# Patient Record
Sex: Male | Born: 1955 | Race: Black or African American | Hispanic: No | Marital: Married
Health system: Southern US, Community
[De-identification: ages and names within clinical notes are randomized; demographics above are authoritative.]

---

## 1999-11-11 ENCOUNTER — Encounter: Payer: Self-pay | Admitting: Orthopedic Surgery

## 1999-11-11 ENCOUNTER — Encounter: Admission: RE | Admit: 1999-11-11 | Discharge: 1999-11-11 | Payer: Self-pay | Admitting: Orthopedic Surgery

## 2000-03-08 ENCOUNTER — Encounter: Payer: Self-pay | Admitting: Orthopedic Surgery

## 2000-03-08 ENCOUNTER — Encounter: Admission: RE | Admit: 2000-03-08 | Discharge: 2000-03-08 | Payer: Self-pay | Admitting: Orthopedic Surgery

## 2001-02-22 ENCOUNTER — Encounter: Payer: Self-pay | Admitting: Orthopedic Surgery

## 2001-02-22 ENCOUNTER — Encounter: Admission: RE | Admit: 2001-02-22 | Discharge: 2001-02-22 | Payer: Self-pay | Admitting: Orthopedic Surgery

## 2001-03-13 ENCOUNTER — Encounter: Admission: RE | Admit: 2001-03-13 | Discharge: 2001-03-13 | Payer: Self-pay | Admitting: Orthopedic Surgery

## 2001-03-13 ENCOUNTER — Encounter: Payer: Self-pay | Admitting: Orthopedic Surgery

## 2001-03-28 ENCOUNTER — Encounter: Admission: RE | Admit: 2001-03-28 | Discharge: 2001-03-28 | Payer: Self-pay | Admitting: Orthopedic Surgery

## 2001-03-28 ENCOUNTER — Encounter: Payer: Self-pay | Admitting: Orthopedic Surgery

## 2001-08-23 ENCOUNTER — Encounter: Admission: RE | Admit: 2001-08-23 | Discharge: 2001-08-23 | Payer: Self-pay | Admitting: Orthopedic Surgery

## 2001-08-23 ENCOUNTER — Encounter: Payer: Self-pay | Admitting: Orthopedic Surgery

## 2003-02-18 ENCOUNTER — Encounter: Admission: RE | Admit: 2003-02-18 | Discharge: 2003-02-18 | Payer: Self-pay | Admitting: Orthopedic Surgery

## 2003-02-18 ENCOUNTER — Encounter: Payer: Self-pay | Admitting: Orthopedic Surgery

## 2005-09-11 ENCOUNTER — Encounter: Admission: RE | Admit: 2005-09-11 | Discharge: 2005-09-11 | Payer: Self-pay | Admitting: Orthopedic Surgery

## 2006-04-15 ENCOUNTER — Inpatient Hospital Stay (HOSPITAL_COMMUNITY): Admission: RE | Admit: 2006-04-15 | Discharge: 2006-04-20 | Payer: Self-pay | Admitting: Orthopedic Surgery

## 2007-08-02 ENCOUNTER — Encounter: Admission: RE | Admit: 2007-08-02 | Discharge: 2007-08-02 | Payer: Self-pay | Admitting: Orthopedic Surgery

## 2007-12-23 IMAGING — CR DG CHEST 2V
2 series · 2 of 2 positions shown · non-contrast
Comparison: None.

CLINICAL DATA: Preadmission chest x-ray for osteoarthritis.

[view not recorded (1 of 2)]
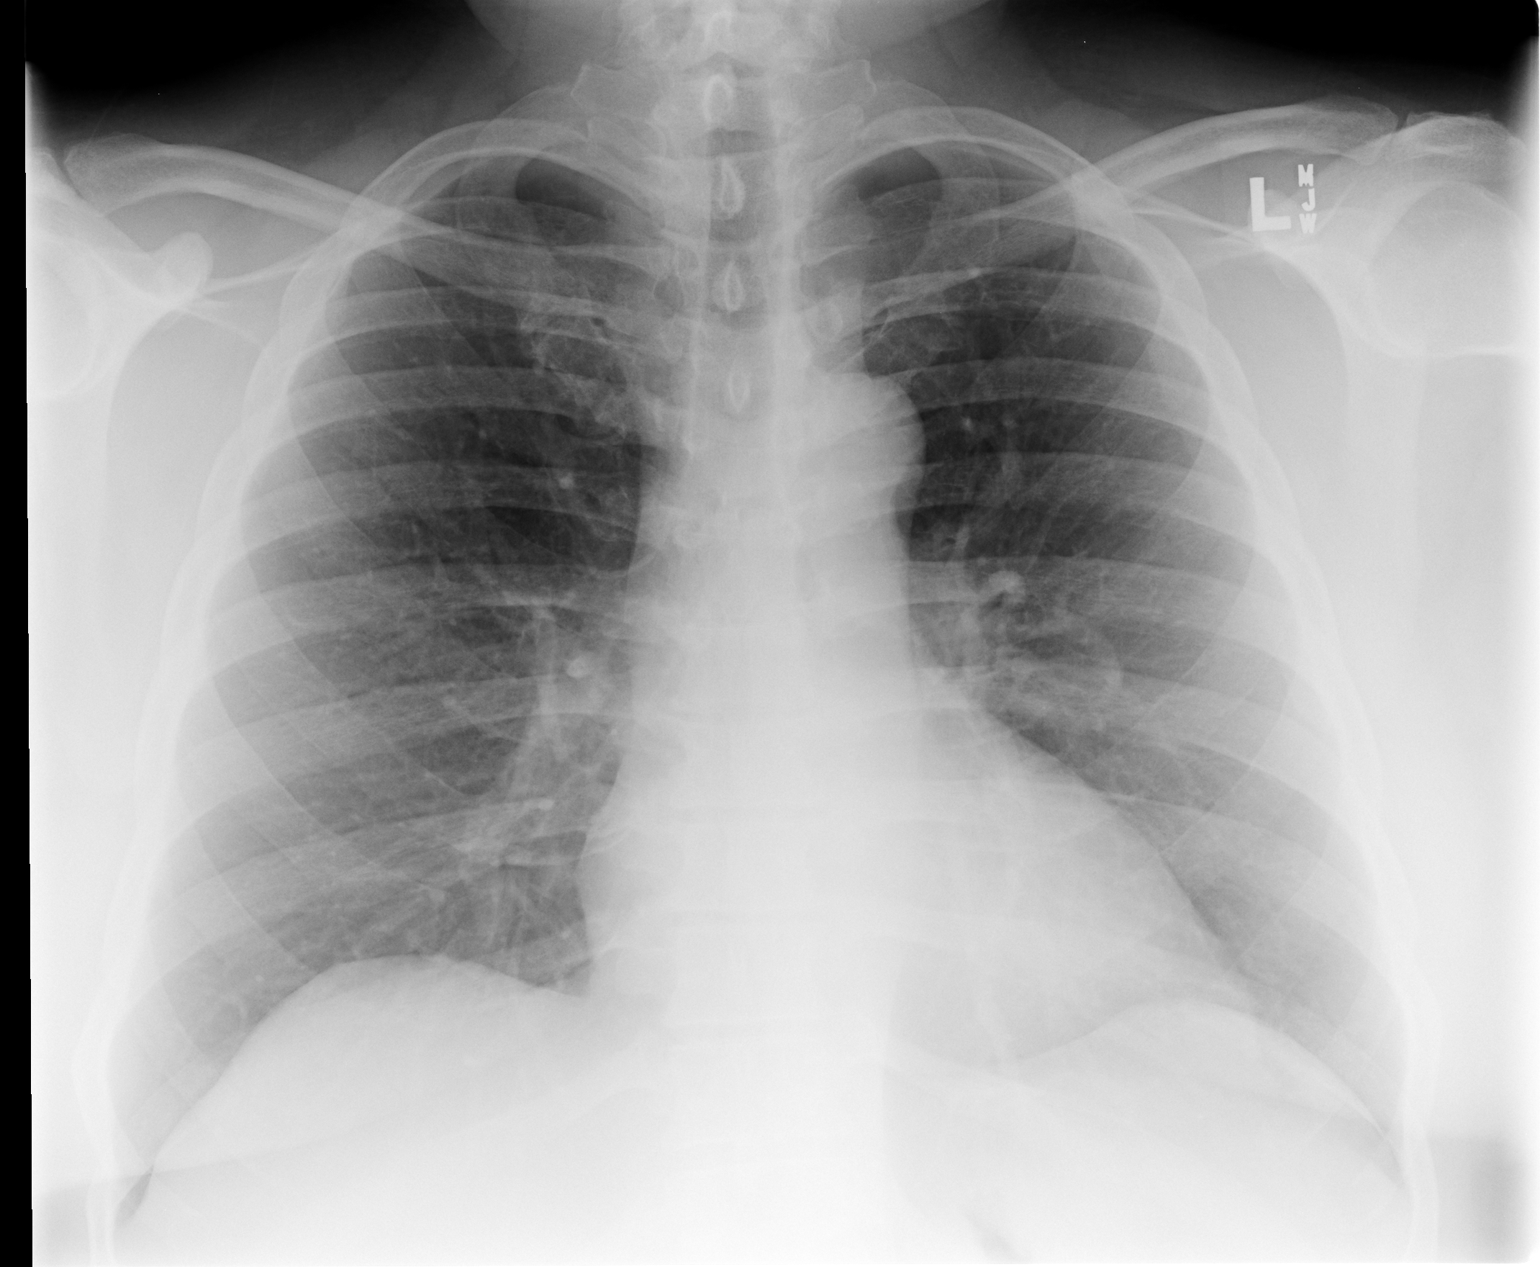

[view not recorded (2 of 2)]
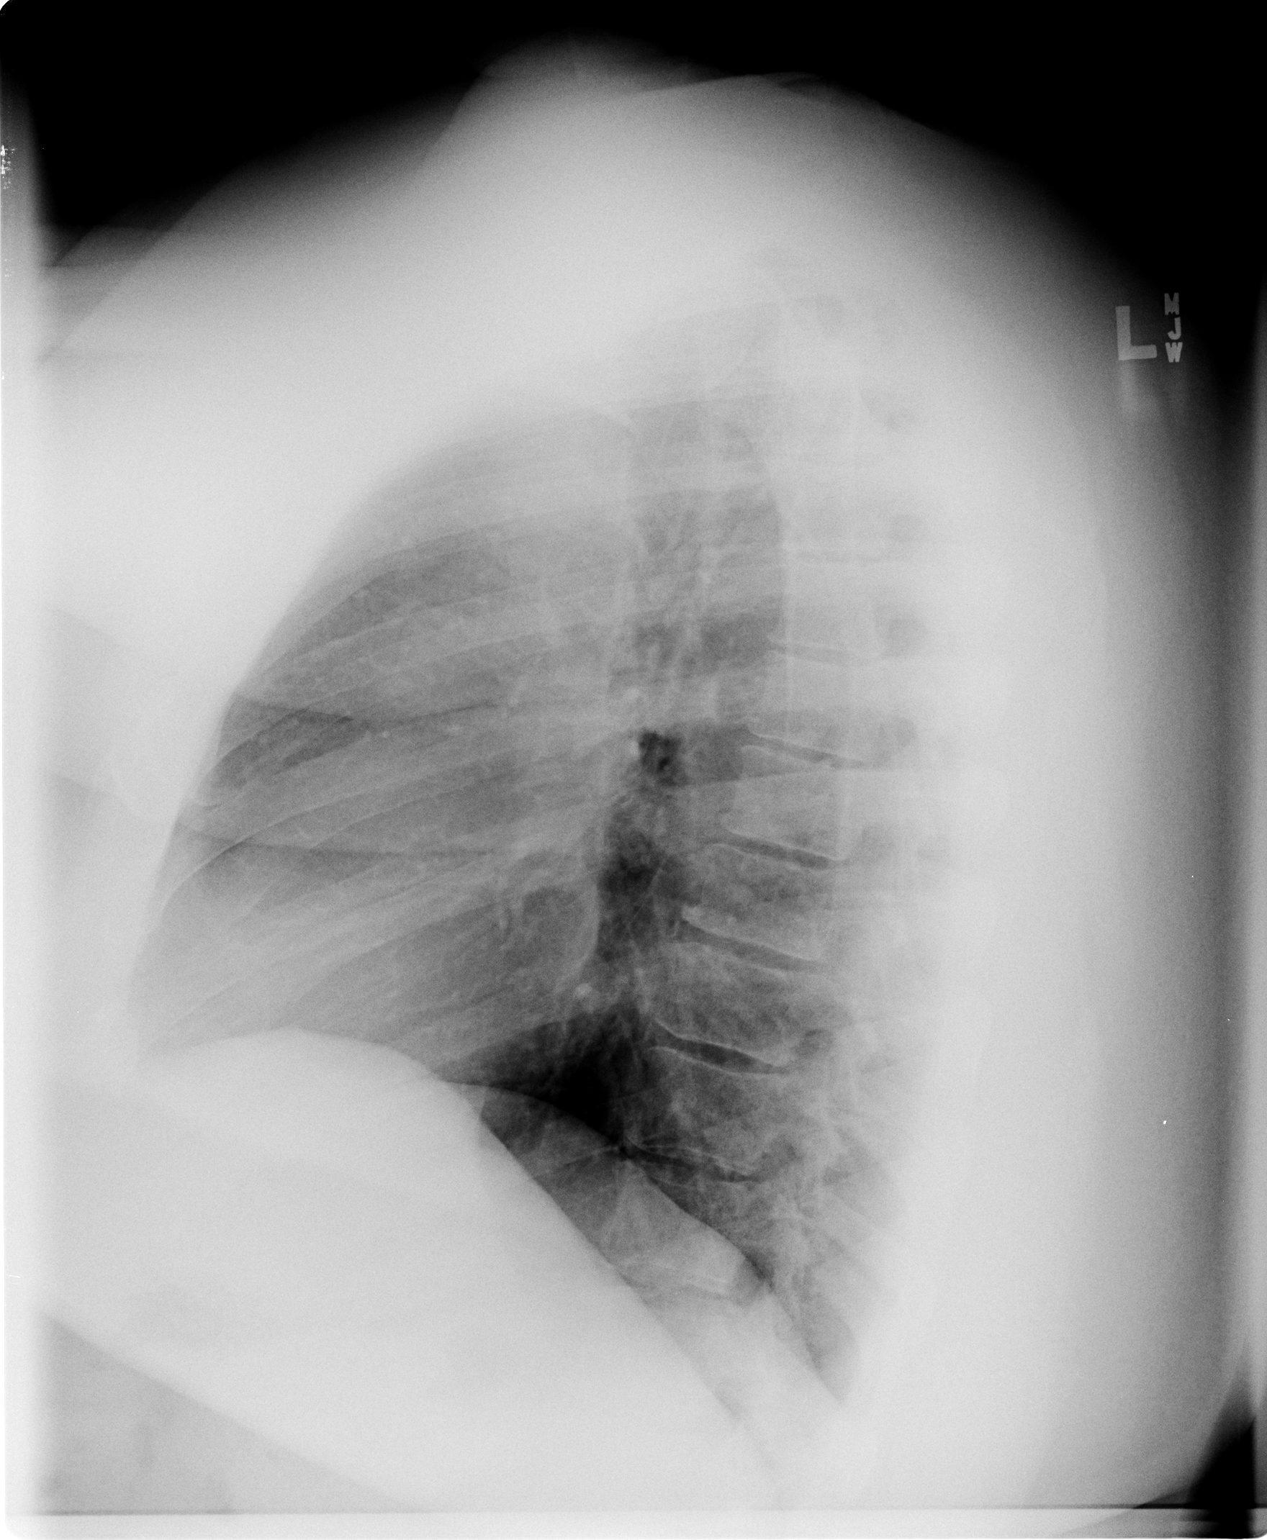

[2 of 2 positions shown; findings below may reference images not displayed]

CHEST - 2 VIEW:

The lungs are clear.  The cardiopericardial silhouette is within normal limits
for size.  Visualized bony structures of the thorax are intact.
IMPRESSION: No acute cardiopulmonary process

## 2008-09-20 ENCOUNTER — Inpatient Hospital Stay (HOSPITAL_COMMUNITY): Admission: RE | Admit: 2008-09-20 | Discharge: 2008-09-23 | Payer: Self-pay | Admitting: Orthopedic Surgery

## 2010-09-16 LAB — GLUCOSE, CAPILLARY
Glucose-Capillary: 109 mg/dL — ABNORMAL HIGH (ref 70–99)
Glucose-Capillary: 121 mg/dL — ABNORMAL HIGH (ref 70–99)
Glucose-Capillary: 126 mg/dL — ABNORMAL HIGH (ref 70–99)
Glucose-Capillary: 132 mg/dL — ABNORMAL HIGH (ref 70–99)
Glucose-Capillary: 138 mg/dL — ABNORMAL HIGH (ref 70–99)
Glucose-Capillary: 141 mg/dL — ABNORMAL HIGH (ref 70–99)
Glucose-Capillary: 145 mg/dL — ABNORMAL HIGH (ref 70–99)
Glucose-Capillary: 145 mg/dL — ABNORMAL HIGH (ref 70–99)
Glucose-Capillary: 180 mg/dL — ABNORMAL HIGH (ref 70–99)
Glucose-Capillary: 96 mg/dL (ref 70–99)

## 2010-09-16 LAB — URINALYSIS, ROUTINE W REFLEX MICROSCOPIC
Bilirubin Urine: NEGATIVE
Hgb urine dipstick: NEGATIVE
Protein, ur: NEGATIVE mg/dL
Urobilinogen, UA: 0.2 mg/dL (ref 0.0–1.0)

## 2010-09-16 LAB — BASIC METABOLIC PANEL
BUN: 9 mg/dL (ref 6–23)
CO2: 27 mEq/L (ref 19–32)
CO2: 29 mEq/L (ref 19–32)
Calcium: 8.5 mg/dL (ref 8.4–10.5)
Calcium: 8.6 mg/dL (ref 8.4–10.5)
Chloride: 102 mEq/L (ref 96–112)
Chloride: 103 mEq/L (ref 96–112)
Creatinine, Ser: 0.76 mg/dL (ref 0.4–1.5)
Creatinine, Ser: 0.8 mg/dL (ref 0.4–1.5)
GFR calc Af Amer: >60 mL/min (ref >60)
GFR calc Af Amer: >60 mL/min (ref >60)
GFR calc non Af Amer: >60 mL/min (ref >60)
Glucose, Bld: 113 mg/dL — ABNORMAL HIGH (ref 70–99)
Glucose, Bld: 138 mg/dL — ABNORMAL HIGH (ref 70–99)
Potassium: 3.5 mEq/L (ref 3.5–5.1)
Potassium: 3.9 mEq/L (ref 3.5–5.1)
Sodium: 134 mEq/L — ABNORMAL LOW (ref 135–145)
Sodium: 137 mEq/L (ref 135–145)

## 2010-09-16 LAB — DIFFERENTIAL
Eosinophils Absolute: 0.2 10*3/uL (ref 0.0–0.7)
Lymphocytes Relative: 25 % (ref 12–46)
Lymphs Abs: 2 10*3/uL (ref 0.7–4.0)
Neutro Abs: 5.5 10*3/uL (ref 1.7–7.7)
Neutrophils Relative %: 67 % (ref 43–77)

## 2010-09-16 LAB — COMPREHENSIVE METABOLIC PANEL
ALT: 18 U/L (ref 0–53)
BUN: 12 mg/dL (ref 6–23)
CO2: 26 mEq/L (ref 19–32)
Calcium: 9.2 mg/dL (ref 8.4–10.5)
Creatinine, Ser: 0.86 mg/dL (ref 0.4–1.5)
GFR calc non Af Amer: >60 mL/min (ref >60)
Glucose, Bld: 121 mg/dL — ABNORMAL HIGH (ref 70–99)

## 2010-09-16 LAB — CBC
HCT: 32.1 % — ABNORMAL LOW (ref 39.0–52.0)
HCT: 32.3 % — ABNORMAL LOW (ref 39.0–52.0)
HCT: 41.3 % (ref 39.0–52.0)
Hemoglobin: 10.7 g/dL — ABNORMAL LOW (ref 13.0–17.0)
Hemoglobin: 11 g/dL — ABNORMAL LOW (ref 13.0–17.0)
Hemoglobin: 13.8 g/dL (ref 13.0–17.0)
MCHC: 33 g/dL (ref 30.0–36.0)
MCHC: 33.5 g/dL (ref 30.0–36.0)
MCV: 84.4 fL (ref 78.0–100.0)
MCV: 83.8 fL (ref 78.0–100.0)
Platelets: 244 10*3/uL (ref 150–400)
RBC: 3.79 MIL/uL — ABNORMAL LOW (ref 4.22–5.81)
RBC: 3.9 MIL/uL — ABNORMAL LOW (ref 4.22–5.81)
RBC: 4.93 MIL/uL (ref 4.22–5.81)
RDW: 15.1 % (ref 11.5–15.5)
WBC: 9.8 10*3/uL (ref 4.0–10.5)

## 2010-09-16 LAB — URINE CULTURE

## 2010-09-16 LAB — HEMOGLOBIN A1C
Hgb A1c MFr Bld: 7.1 % — ABNORMAL HIGH (ref 4.6–6.1)
Mean Plasma Glucose: 157 mg/dL

## 2010-09-16 LAB — PROTIME-INR
INR: 1 (ref 0.00–1.49)
Prothrombin Time: 13.1 seconds (ref 11.6–15.2)

## 2010-10-20 NOTE — Op Note (Signed)
NAME:  Clayton Dunn, Clayton Dunn NO.:  [id number]   MEDICAL RECORD NO.:  [id number]          PATIENT TYPE:  INP   LOCATION:  5003                         FACILITY:  [facility]   PHYSICIAN:  Dyke Brackett, M.D.    DATE OF BIRTH:  1955/10/24   DATE OF PROCEDURE:  09/20/2008  DATE OF DISCHARGE:                               OPERATIVE REPORT   INDICATIONS:  This is a 55 year old with longstanding severe  osteoarthritis, previous history work injury to his left knee thought to  be amenable to knee replacement.   PREOPERATIVE DIAGNOSIS:  Severe osteoarthritis of the left knee with  morbid obesity, weight over 350 pounds.   POSTOPERATIVE DIAGNOSIS:  Severe osteoarthritis of the left knee with  morbid obesity, weight over 350 pounds.   OPERATION:  Left total knee replacement (LCS, large femur and large  patella, size five tibia with 10-mm bearing).   SURGEON:  Dyke Brackett, MD   ASSISTANT:  Joslyn Devon. [place], [place]   BLOOD LOSS:  Minimal.   TOURNIQUET TIME:  One hour and 15 minutes.   DESCRIPTION OF PROCEDURE:  Sterile prep and drape, exsanguination of  legs, placed in the 400.  A straight skin incision, medial parapatellar  approach to the knee made.  Exposure was more difficult secondary to the  patient's large size.  Medial release was carried out somewhat at least  medial stripping to expose the medial tibia, releasing tight medial  collateral.  Patella was everted.  External guide system was used  cutting the tibia about 2.5 mm below the most diseased lateral  compartment.  This was followed by an anterior-posterior femoral cut  with a 10-degree spacer followed by distal 4-degree valgus cut, flexion  gap equal extension gap.  Chamfer cuts were made with keel hole for the  prosthesis after final release and excision of remnants of menisci and  cruciate ligaments.   Attention was next turned to the tibia, sized to be a 5 with the keel  hole cut with the tibia followed by a  trial femur and cutting the  patella leaving about 17 mm of relatively thick patella.  Three PEG  trial of the patella was placed.  Trial reduction carried out, full  extension.  No tendency, varus-valgus instability.  No tendency to  bearing spin-out.  Trial components were removed and the final  components were inserted after irrigation.  Two batches of cement were  used with 1 g of vancomycin and 1.2 g of tobramycin, and a total of 2  batches of cement.  Cement was allowed to harden.  Excess cement was  removed.  Trial bearing was placed after release of the tourniquet.  No  excessive bleeding or cement was noted in the back of the knee.  Final  bearing was inserted and all parameters noted above  were acceptable.  Closure was effected with interrupted Ethibond with  Hemovac drain placed superolaterally and staples on the skin with  Marcaine with epinephrine into the joint as well as the skin that the  patient had deferred in a preoperative block.      W.  Dava Najjar, M.D.  Electronically Signed     WDC/MEDQ  D:  09/20/2008  T:  09/20/2008  Job:  [id number]

## 2010-10-23 NOTE — Discharge Summary (Signed)
NAME:  Clayton Dunn, Clayton Dunn NO.:  [id number]   MEDICAL RECORD NO.:  [id number]           PATIENT TYPE:   LOCATION:                                 FACILITY:   PHYSICIAN:  Dyke Brackett, M.D.         DATE OF BIRTH:   DATE OF ADMISSION:  04/15/2006  DATE OF DISCHARGE:                               DISCHARGE SUMMARY   ADMISSION DIAGNOSIS:  1. Osteoarthritis of the right knee.   DISCHARGE DIAGNOSES:  1. Osteoarthritis of the right knee.  2. Noninsulin dependent diabetes mellitus.  3. Hypertension.  4. Obesity.  5. Cigarette smoker.  6. Esophageal reflux.  7. Hematuria.   PROCEDURE:  Right total knee arthroplasty.   HISTORY:  A 55 year old African-American male with longstanding problems  with right knee pain dating back to 1984 when he had [place] procedure to  the right knee.  He has had significant pain and difficulties with  activities of daily living.  He has failed conservative treatment at  this time.  Radiographically, he had noted endstage osteoarthritis of  the right knee.  He is admitted at this time for right total knee  arthroplasty.   HOSPITAL COURSE:  A 55 year old African-American male admitted April 15, 2006.  After appropriate laboratory studies were obtained as well as  1 gram IV Ancef was performed, he was taken to the operating room on  April 15, 2006, where he underwent a right total knee arthroplasty by  Dr. Dyke Brackett, M.D., assisted by Richardean Canal, P.A.C.  He tolerated  the procedure well.   He as continued on Ancef 1 gram IV q.8h. x3 doses.  Lovenox 30 mg  subcutaneous q.12h. was begun for antithrombotic prophylaxis.  CPM 0 to  90 degrees for six to eight hours per day was started.  Consultations,  PT/OT and case management were performed.  Partial weightbearing 50% on  the right knee.  Hemovac was clamped for four hours with greater than  100 mL out.   The following day, he was allowed out of bed to a chair.  He was  weaned  to oral pain medicines.  His IV and PCA were discontinued on April 17, 2006.  Hemovac was also discontinued.  The remainder of his hospital  course was uneventful, except for having some hematuria by his  statement.  Unfortunately, because of problems with obtaining the  urinalysis, we were unable to discharge him on April 19, 2006 because  of laboratory difficulties.  He was however discharged on April 20, 2006, to return back to the office in followup.  Discharged in improved  condition.   EKG was read as normal sinus rhythm.  Radiographic studies April 15, 2006 revealed right knee without complicating features.  Preoperative  hemoglobin 13.7, hematocrit 41.2%, white count 8300, platelets 325,000.  Discharge hemoglobin 11.0, hematocrit 32.8%, white count 10,500,  platelets 272,000.  Preoperative pro time 12.7, INR 0.9, PTT 32.  Preoperative chemistry:  Sodium 139, potassium 4.1, chloride 108, CO2  25, glucose 83, BUN 11, creatinine 0.7, calcium 9.3, total protein 7.3,  albumin  3.7. AST 21, ALT 34, ALP 77, bilirubin 0.6.  Discharge sodium  138, potassium 3.6, chloride 104, CO2 27, glucose 123, BUN 9, creatinine  0.8, calcium 8.6.  Urinalysis of April 11, 2006, was benign for void  urine.  April 19, 2006, revealed a small amount of hemoglobin, no red  cells or white cells and bacteria were seen.  Blood type is A positive,  antibody screen negative.  Urine culture of April 11, 2006, showed  insignificant growth.  Culture of April 19, 2006 revealed no growth.   DISCHARGE INSTRUCTIONS:  He is to return to his restricted diet for  diabetes as previously noted.  No driving or lifting for six weeks.  Increase activity slowly.  Use his crutches or walker.  May shower.  Fifty percent weight bearing as taught by physical therapy.  Keep his  incision clean and dry.  Cover it with a dressing daily.   Prescriptions for Percocet 5/325 one to two tablets every four  hours as  needed for pain.  Lovenox 40 mg injection done at 8 a.m.  Robaxin 500 mg  1 tablets every 6 hours as needed for pain.   He is to follow back up with Dr. Madelon Lips in two weeks from surgery.  Call his family doctor to follow up for his hematuria.  He is to follow  the flu instruction sheet.  Takes his temperature four times a day and  call if they are elevated to 101 degrees.  CPM 0 to 90.  Increase daily  by 5 to 10 degrees.  Use his CPM 6 to 8 hours per day.   He was discharged in good condition.      Clayton Dunn, P.A.-C.      Dyke Brackett, M.D.  Electronically Signed    BDP/MEDQ  D:  05/24/2006  T:  05/24/2006  Job:  [id number]

## 2010-10-23 NOTE — Discharge Summary (Signed)
Clayton Dunn, Clayton Dunn           ACCOUNT NO.:  [id number]   MEDICAL RECORD NO.:  [id number]          PATIENT TYPE:  INP   LOCATION:  5003                         FACILITY:  [facility]   PHYSICIAN:  Dyke Brackett, M.D.    DATE OF BIRTH:  [birth year]   DATE OF ADMISSION:  09/20/2008  DATE OF DISCHARGE:  09/23/2008                               DISCHARGE SUMMARY   ADMITTING DIAGNOSIS:  Left knee osteoarthritis.   DISCHARGE AND FINAL DIAGNOSIS:  Left knee osteoarthritis, status post  left total knee replacement.   PROCEDURE NOTE:  Clayton Dunn presented to the OR for left knee  osteoarthritis, left total knee replacement.  Procedure was performed by  Dr. Madelon Lips with no complications, 100 mL of blood loss was estimate and  transferred to PACU in stable condition.   HOSPITAL COURSE:  Postoperative day #1 was on September 21, 2008, we can  call Dr. Dion Saucier.  The patient was doing well with slightly elevated  blood pressure.  Exam was benign.  Drain was removed apparently on first  postoperative day.  Postop day #2 was on September 22, 2008, the patient  doing fairly well at that point, blood pressure better controlled.  No  other issues with known regular medical consult.  The patient doing well  with physical therapy.  PCA was discontinued.  Postoperative day #3 was  on September 23, 2008, the patient's blood pressure was well controlled,  vital signs stable, afebrile, hemoglobin at 10.5.  Exam was benign with  no signs of infection.  Left lower extremity is neurovascular intact,  working with physical therapy with 50% weightbearing.   ASSESSMENT AND PLAN:  Status post left total knee replacement.  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  Plan for 14 days  postoperative 40 mg of Lovenox subcu.  CPM 0-90 degrees 6-8 hours per  day.  Plan for recheck  in the office for staple removal within 14 days  postoperatively.   Diet was regular.   Discharge medications were as follows:  1. Percocet 3/325 1-2 tablets every 4-6 hours p.r.n. pain.  2. Robaxin 500 mg 1 tablet q.6-8 hours p.r.n. pain.  3. Lovenox 40 mg subcu at 8 a.m. each day for a total of 14 days      postoperative.  4. Amlodipine 1 tablet daily.  5. Avapro 1 tablet daily.  6. Prevacid 1 tablet daily.  7. Fexofenadine, no doses on this medication, but was noted 1 tablet      daily.  8. Diclofenac 75 mg daily, continued that.  9. Tramadol was discontinued as he is taking Percocet.  10.Januvia.  11.Metformin was apparently nightly dosing.  12.Aspirin 81 mg daily, which was continued.  13.Mucinex spray b.i.d.      Sharol Given, PA      Dyke Brackett, M.D.  Electronically Signed    JBS/MEDQ  D:  10/02/2008  T:  10/02/2008  Job:  [id number]

## 2010-10-23 NOTE — Op Note (Signed)
NAME:  Clayton Dunn, Clayton Dunn NO.:  [id number]   MEDICAL RECORD NO.:  [id number]          PATIENT TYPE:  INP   LOCATION:  2899                         FACILITY:  [facility]   PHYSICIAN:  Dyke Brackett, M.D.    DATE OF BIRTH:  [birth year]   DATE OF PROCEDURE:  DATE OF DISCHARGE:                               OPERATIVE REPORT   INDICATIONS:  The patient is a 55 year old, followed years in my office.  He had extensive previous surgeries before, multiple surgeries of the  knee, making the case technically certainly more demanding than average,  including a very large scar on the medial aspect of the knee.  He had  intractable pain after a debridement, not responding to conservative  treatment.   PREOPERATIVE DIAGNOSIS:  Status post multiple ligamentous surgeries to  the right knee with severe posttraumatic and osteoarthritis.   POSTOPERATIVE DIAGNOSIS:  Status post multiple ligamentous surgeries to  the right knee with severe posttraumatic and osteoarthritis.   PROCEDURE:  Right total knee replacement with an LCS size tibia, large  femur, large patella with 12.5-mm bearing.   SURGEON:  Clayton Dunn.   ASSISTANT:  Clark, PA.   TOURNIQUET TIME:  Approximately 1 hour 35 minutes.   DESCRIPTION OF PROCEDURE:  The patient had an unusual incision from  previous surgery in the 80s, relatively medial placement of the incision  with the distal and proximal ends going more medial and posterior but  the midportion of the incision abutting the medial parapatellar area.  In order to avoid putting parallel incisions on the knee, the knee  excision was used, and it had to be extended proximally to allow access  to the rectus tendon.  The previous surgery had at least included some  type of surgery on the medial aspect of the knee, making exposure  somewhat more difficult than normal, but no complications were  encountered throughout the procedure.  Subcutaneous dissection was made.  Dissection was made somewhat more towards the midline to do a medial  parapatellar approach.  The medial structures were somewhat attenuated,  but care was made to provide a good medial sleeve.  Eversion of the  patella was accomplished without difficulty once the incision was  extended proximally and then stripping of the medial side due to scar  tissue, although somewhat attenuated, was required.  There was a large  amount of hypertrophy and overgrowth, not just osteophytes, on the  medial aspect of the knee on the tibia and the femur requiring trimming  and resection of osteophytes as well as positioning of the prosthesis  slightly, probably back more towards its normal anatomy, discounting the  medial overgrowth.   The tibia was cut 2 mm below the most diseased compartment, and a  revision cut was made to keep the tibia out of varus.  This was followed  by an anterior and posterior femoral cut with the flexion gap measured  at 12.5 mm and a 4 degrees valgus cut of the femur with minimal  resection of the femoral bone.  Flexion gap equal to the extension gap.  Posterior osteophytes were removed.  Menisci  remnants removed as well as  resection of the PCL and ACL remnants.  Chamfer cuts were made in the  [facility] of the prosthesis followed by preparation of the tibia with a  keyhole for the prosthesis.  Size 4 tibia was most appropriate, again  centered on the normal anatomy.  The trial reduction was carried out,  resection of about 12- to 13-mm of native patella, leaving a 14-mm  thickness for a 3-peg patella trial.  Several variations were required  relative to stripping of the medial side with difficult exposure  relative to contract medial tissues, although once they were stripped,  they were somewhat attenuated.  However, good medial sleeve was  maintained.  After trial reduction, final reduction was carried out  after cement was inserted with antibiotic-impregnated cement due to  the  multiple surgeries performed on the knee.  This was done without  difficulty.  Smaller bleeders were coagulated after the cement was  allowed to harden.  The tourniquet was released.  Closure was affected.  Closure was more complicated than the average due to the previous  approach to the knee, but good tissue was obtained to reapproximate the  medial sleeve to the midline tent structures with additional repair of  the VMO which had been advanced probably due to previous surgery.  The  bearing was checked during trial reduction, final reduction, and  conclusion of capsule closure and noted to be stable.  Excellent range  of motion, minimal drawer.  No varus or valgus instability.  Again,  after the capsule was closed, the subcutaneous tissue was closed with  interrupted absorbable sutures, staples on the skin, capsular  infiltration with Marcaine but technically was not able to get a nerve  block.  Was taken to the recovery room in stable condition.      Dyke Brackett, M.D.  Electronically Signed     WDC/MEDQ  D:  04/15/2006  T:  04/16/2006  Job:  [id number]

## 2012-08-01 ENCOUNTER — Other Ambulatory Visit: Payer: Self-pay | Admitting: Neurosurgery

## 2012-08-03 ENCOUNTER — Ambulatory Visit
Admission: RE | Admit: 2012-08-03 | Discharge: 2012-08-03 | Disposition: A | Discharge status: Home / Self Care | Payer: Worker's Compensation | Source: Ambulatory Visit | Attending: Neurosurgery | Admitting: Neurosurgery

## 2012-08-03 DIAGNOSIS — M5137 Other intervertebral disc degeneration, lumbosacral region: Secondary | ICD-10-CM

## 2013-11-05 ENCOUNTER — Other Ambulatory Visit: Payer: Self-pay | Admitting: Orthopedic Surgery

## 2013-11-05 DIAGNOSIS — M549 Dorsalgia, unspecified: Secondary | ICD-10-CM

## 2013-11-22 ENCOUNTER — Other Ambulatory Visit: Payer: Worker's Compensation

## 2013-12-02 ENCOUNTER — Ambulatory Visit
Admission: RE | Admit: 2013-12-02 | Discharge: 2013-12-02 | Disposition: A | Discharge status: Home / Self Care | Payer: Worker's Compensation | Source: Ambulatory Visit | Attending: Orthopedic Surgery | Admitting: Orthopedic Surgery

## 2013-12-02 DIAGNOSIS — M549 Dorsalgia, unspecified: Secondary | ICD-10-CM

## 2015-07-07 ENCOUNTER — Other Ambulatory Visit: Payer: Self-pay | Admitting: Orthopedic Surgery

## 2015-07-10 ENCOUNTER — Other Ambulatory Visit: Payer: Self-pay | Admitting: Orthopedic Surgery

## 2015-07-10 DIAGNOSIS — M545 Low back pain: Secondary | ICD-10-CM

## 2015-07-16 ENCOUNTER — Ambulatory Visit
Admission: RE | Admit: 2015-07-16 | Discharge: 2015-07-16 | Disposition: A | Discharge status: Home / Self Care | Payer: Worker's Compensation | Source: Ambulatory Visit | Attending: Orthopedic Surgery | Admitting: Orthopedic Surgery

## 2015-07-16 DIAGNOSIS — M545 Low back pain: Secondary | ICD-10-CM

## 2016-02-19 ENCOUNTER — Other Ambulatory Visit: Payer: Self-pay | Admitting: Orthopedic Surgery

## 2016-02-19 DIAGNOSIS — M542 Cervicalgia: Secondary | ICD-10-CM

## 2016-02-27 ENCOUNTER — Other Ambulatory Visit: Payer: Self-pay

## 2016-03-01 ENCOUNTER — Other Ambulatory Visit: Payer: Self-pay

## 2016-03-08 ENCOUNTER — Ambulatory Visit
Admission: RE | Admit: 2016-03-08 | Discharge: 2016-03-08 | Disposition: A | Discharge status: Home / Self Care | Payer: 59 | Source: Ambulatory Visit | Attending: Orthopedic Surgery | Admitting: Orthopedic Surgery

## 2016-03-08 DIAGNOSIS — M542 Cervicalgia: Secondary | ICD-10-CM

## 2016-04-06 ENCOUNTER — Other Ambulatory Visit: Payer: Self-pay | Admitting: Orthopedic Surgery

## 2016-04-06 DIAGNOSIS — M75101 Unspecified rotator cuff tear or rupture of right shoulder, not specified as traumatic: Secondary | ICD-10-CM

## 2016-04-06 DIAGNOSIS — M25511 Pain in right shoulder: Secondary | ICD-10-CM

## 2016-04-12 ENCOUNTER — Ambulatory Visit
Admission: RE | Admit: 2016-04-12 | Discharge: 2016-04-12 | Disposition: A | Discharge status: Home / Self Care | Payer: 59 | Source: Ambulatory Visit | Attending: Orthopedic Surgery | Admitting: Orthopedic Surgery

## 2016-04-12 DIAGNOSIS — M75101 Unspecified rotator cuff tear or rupture of right shoulder, not specified as traumatic: Secondary | ICD-10-CM

## 2016-04-12 DIAGNOSIS — M25511 Pain in right shoulder: Secondary | ICD-10-CM

## 2020-01-07 ENCOUNTER — Other Ambulatory Visit: Payer: Self-pay | Admitting: Orthopedic Surgery

## 2020-01-07 DIAGNOSIS — M5416 Radiculopathy, lumbar region: Secondary | ICD-10-CM

## 2020-01-07 DIAGNOSIS — M545 Low back pain, unspecified: Secondary | ICD-10-CM

## 2020-02-12 ENCOUNTER — Other Ambulatory Visit: Payer: Self-pay

## 2020-02-12 ENCOUNTER — Ambulatory Visit
Admission: RE | Admit: 2020-02-12 | Discharge: 2020-02-12 | Disposition: A | Discharge status: Home / Self Care | Payer: Worker's Compensation | Source: Ambulatory Visit | Attending: Orthopedic Surgery | Admitting: Orthopedic Surgery

## 2020-02-12 DIAGNOSIS — M545 Low back pain, unspecified: Secondary | ICD-10-CM

## 2020-02-12 DIAGNOSIS — M5416 Radiculopathy, lumbar region: Secondary | ICD-10-CM

## 2020-07-10 ENCOUNTER — Other Ambulatory Visit: Payer: Self-pay | Admitting: Orthopedic Surgery

## 2020-07-10 DIAGNOSIS — M48062 Spinal stenosis, lumbar region with neurogenic claudication: Secondary | ICD-10-CM

## 2020-07-25 ENCOUNTER — Other Ambulatory Visit: Payer: 59

## 2020-07-28 ENCOUNTER — Ambulatory Visit
Admission: RE | Admit: 2020-07-28 | Discharge: 2020-07-28 | Disposition: A | Discharge status: Home / Self Care | Payer: Worker's Compensation | Source: Ambulatory Visit | Attending: Orthopedic Surgery | Admitting: Orthopedic Surgery

## 2020-07-28 ENCOUNTER — Other Ambulatory Visit: Payer: Self-pay

## 2020-07-28 DIAGNOSIS — M48062 Spinal stenosis, lumbar region with neurogenic claudication: Secondary | ICD-10-CM

## 2022-09-21 ENCOUNTER — Encounter (HOSPITAL_BASED_OUTPATIENT_CLINIC_OR_DEPARTMENT_OTHER): Payer: Self-pay | Admitting: Physician Assistant

## 2022-09-21 ENCOUNTER — Other Ambulatory Visit (HOSPITAL_BASED_OUTPATIENT_CLINIC_OR_DEPARTMENT_OTHER): Payer: Self-pay | Admitting: Physician Assistant

## 2022-09-21 DIAGNOSIS — M25512 Pain in left shoulder: Secondary | ICD-10-CM

## 2022-10-06 ENCOUNTER — Ambulatory Visit (HOSPITAL_BASED_OUTPATIENT_CLINIC_OR_DEPARTMENT_OTHER): Payer: Medicare Other | Attending: Physician Assistant
